# Patient Record
Sex: Male | Born: 1935 | Race: Black or African American | Hispanic: No | Smoking: Former smoker
Health system: Southern US, Community
[De-identification: ages and names within clinical notes are randomized; demographics above are authoritative.]

## PROBLEM LIST (undated history)

## (undated) DIAGNOSIS — C801 Malignant (primary) neoplasm, unspecified: Secondary | ICD-10-CM

## (undated) DIAGNOSIS — I10 Essential (primary) hypertension: Secondary | ICD-10-CM

## (undated) DIAGNOSIS — Z972 Presence of dental prosthetic device (complete) (partial): Secondary | ICD-10-CM

## (undated) DIAGNOSIS — E785 Hyperlipidemia, unspecified: Secondary | ICD-10-CM

## (undated) HISTORY — PX: COLON SURGERY: SHX602

## (undated) HISTORY — PX: PROSTATE SURGERY: SHX751

---

## 1998-03-03 DIAGNOSIS — I219 Acute myocardial infarction, unspecified: Secondary | ICD-10-CM

## 1998-03-03 HISTORY — DX: Acute myocardial infarction, unspecified: I21.9

## 2018-03-08 ENCOUNTER — Encounter: Payer: Self-pay | Admitting: Emergency Medicine

## 2018-03-08 ENCOUNTER — Ambulatory Visit: Payer: Medicare Other

## 2018-03-08 ENCOUNTER — Ambulatory Visit
Admission: EM | Admit: 2018-03-08 | Discharge: 2018-03-08 | Disposition: A | Discharge status: Home / Self Care | Payer: Medicare Other | Attending: Family Medicine | Admitting: Family Medicine

## 2018-03-08 ENCOUNTER — Other Ambulatory Visit: Payer: Self-pay

## 2018-03-08 DIAGNOSIS — J181 Lobar pneumonia, unspecified organism: Secondary | ICD-10-CM

## 2018-03-08 DIAGNOSIS — Z87891 Personal history of nicotine dependence: Secondary | ICD-10-CM

## 2018-03-08 DIAGNOSIS — J189 Pneumonia, unspecified organism: Secondary | ICD-10-CM

## 2018-03-08 HISTORY — DX: Malignant (primary) neoplasm, unspecified: C80.1

## 2018-03-08 HISTORY — DX: Hyperlipidemia, unspecified: E78.5

## 2018-03-08 HISTORY — DX: Essential (primary) hypertension: I10

## 2018-03-08 MED ORDER — HYDROCOD POLST-CPM POLST ER 10-8 MG/5ML PO SUER: 5.0000 mL | Freq: Every evening | ORAL | 0 refills | Status: DC | PRN

## 2018-03-08 MED ORDER — AZITHROMYCIN 250 MG PO TABS: ORAL_TABLET | ORAL | 0 refills | Status: DC

## 2018-03-08 MED ORDER — AMOXICILLIN-POT CLAVULANATE ER 1000-62.5 MG PO TB12: 2.0000 | ORAL_TABLET | Freq: Two times a day (BID) | ORAL | 0 refills | Status: AC

## 2018-03-08 NOTE — ED Provider Notes (Signed)
[facility]    CSN: [id number] Arrival date & time: 03/08/18  1059  History   Chief Complaint Chief Complaint  Patient presents with  . Cough   HPI  85 year old male presents with cough.  2-week history of cough.  Severe cough.  Associated shortness of breath.  Worse over the past 2 days.  No fever.  No chills.  No medications or interventions tried.  Patient states that he did not feel comfortable taking anything due to his high blood pressure.  Patient is a former smoker.  He smoked from age 87 to age 89.  Worse at night.  No relieving factors.  No other associated symptoms.  No other complaints.  History reviewed and updated as below.  Past Medical History:  Diagnosis Date  . Cancer [facility])    colon, prostate  . Hyperlipemia   . Hypertension    Past Surgical History:  Procedure Laterality Date  . COLON SURGERY    . PROSTATE SURGERY     Home Medications    Prior to Admission medications   Medication Sig Start Date End Date Taking? Authorizing Provider  amLODipine-olmesartan (AZOR) 10-40 MG tablet Take 1 tablet by mouth daily.   Yes [provider]  aspirin 81 MG chewable tablet Chew by mouth daily.   Yes [provider]  atorvastatin (LIPITOR) 40 MG tablet Take 40 mg by mouth daily.   Yes [provider]  isosorbide mononitrate (IMDUR) 120 MG 24 hr tablet Take 120 mg by mouth daily.   Yes [provider]  metoprolol succinate (TOPROL-XL) 100 MG 24 hr tablet Take 100 mg by mouth daily. Take with or immediately following a meal.   Yes [provider]  amoxicillin-clavulanate (AUGMENTIN XR) 1000-62.5 MG 12 hr tablet Take 2 tablets by mouth 2 (two) times daily for 7 days. 03/08/18 03/15/18  Coral Spikes, DO  azithromycin (ZITHROMAX) 250 MG tablet 2 tablets on day 1, then 1 tablet daily on days 2-5. 03/08/18   Coral Spikes, DO  chlorpheniramine-HYDROcodone (TUSSIONEX PENNKINETIC ER) 10-8 MG/5ML SUER Take 5 mLs by mouth at  bedtime as needed. 03/08/18   Coral Spikes, DO   Social History Social History   Tobacco Use  . Smoking status: Former Smoker    Types: Cigarettes  . Smokeless tobacco: Never Used  Substance Use Topics  . Alcohol use: Not Currently    Frequency: Never    Comment: stopped drinking in 1976  . Drug use: Never     Allergies   Patient has no known allergies.   Review of Systems Review of Systems  Constitutional: Negative for fever.  Respiratory: Positive for cough and shortness of breath.    Physical Exam Triage Vital Signs ED Triage Vitals  Enc Vitals Group     BP 03/08/18 1139 116/62     Pulse Rate 03/08/18 1139 80     Resp 03/08/18 1139 16     Temp 03/08/18 1139 98.7 F (37.1 C)     Temp Source 03/08/18 1139 Oral     SpO2 03/08/18 1139 95 %     Weight 03/08/18 1133 182 lb (82.6 kg)     Height 03/08/18 1133 5\' 9"  (1.753 m)     Head Circumference --      Peak Flow --      Pain Score 03/08/18 1133 0     Pain Loc --      Pain Edu? --      Excl. in  [place]? --    Updated Vital Signs BP 116/62 (BP Location: Left Arm)   Pulse 80   Temp 98.7 F (37.1 C) (Oral)   Resp 16   Ht 5\' 9"  (1.753 m)   Wt 82.6 kg   SpO2 95%   BMI 26.88 kg/m   Visual Acuity Right Eye Distance:   Left Eye Distance:   Bilateral Distance:    Right Eye Near:   Left Eye Near:    Bilateral Near:     Physical Exam Constitutional:      General: He is not in acute distress.    Appearance: Normal appearance.  HENT:     Head: Normocephalic and atraumatic.     Mouth/Throat:     Pharynx: Oropharynx is clear. No posterior oropharyngeal erythema.  Eyes:     General:        Right eye: No discharge.        Left eye: No discharge.     Conjunctiva/sclera: Conjunctivae normal.  Cardiovascular:     Rate and Rhythm: Normal rate and regular rhythm.  Pulmonary:     Effort: Pulmonary effort is normal.     Breath sounds: Wheezing and rales present.  Neurological:     Mental Status: He is alert.    Psychiatric:        Mood and Affect: Mood normal.        Behavior: Behavior normal.    UC Treatments / Results  Labs (all labs ordered are listed, but only abnormal results are displayed) Labs Reviewed - No data to display  EKG None  Radiology Dg Chest 2 View  Result Date: 03/08/2018 CLINICAL DATA:  Nonproductive cough for several weeks. Coronary artery disease. Personal history of colon and prostate carcinoma EXAM: CHEST - 2 VIEW COMPARISON:  None. FINDINGS: The heart size and mediastinal contours are within normal limits. Aortic atherosclerosis. Mild asymmetric opacity is seen in the left lower lobe, consistent with pneumonia. Right lung is clear. No evidence of pleural effusion. IMPRESSION: Mild left lower lobe infiltrate, consistent with pneumonia. Recommend clinical correlation, and followup PA and lateral chest X-ray in several weeks to ensure resolution. Electronically Signed   By: Earle Gell M.D.   On: 03/08/2018 13:08    Procedures Procedures (including critical care time)  Medications Ordered in UC Medications - No data to display  Initial Impression / Assessment and Plan / UC Course  I have reviewed the triage vital signs and the nursing notes.  Pertinent labs & imaging results that were available during my care of the patient were reviewed by me and considered in my medical decision making (see chart for details).    85 year old male presents with community-acquired pneumonia.  Treating with Augmentin and azithromycin.  Tussionex at night for cough advised repeat x-ray in 3 to 4 weeks by his PCP to ensure resolution.  Final Clinical Impressions(s) / UC Diagnoses   Final diagnoses:  Community acquired pneumonia of left lower lobe of lung ([place])     Discharge Instructions     Take both antibiotics as prescribed.  Cough medication as needed.  Repeat xray in 3-4 weeks.  Take care  Dr. Lacinda Axon    ED Prescriptions    Medication Sig Dispense Auth. Provider    amoxicillin-clavulanate (AUGMENTIN XR) 1000-62.5 MG 12 hr tablet Take 2 tablets by mouth 2 (two) times daily for 7 days. 28 tablet Annai Heick G, DO   azithromycin (ZITHROMAX) 250 MG tablet 2 tablets on day 1, then 1  tablet daily on days 2-5. 6 tablet [place], Carthel Castille G, DO   chlorpheniramine-HYDROcodone (TUSSIONEX PENNKINETIC ER) 10-8 MG/5ML SUER Take 5 mLs by mouth at bedtime as needed. 60 mL Coral Spikes, DO     Controlled Substance Prescriptions [place] Controlled Substance Registry consulted? Not Applicable   Coral Spikes, DO 03/08/18 1423

## 2018-03-08 NOTE — ED Triage Notes (Signed)
Pt c/o dry cough, runny nose and shortness of breath. Cough is keeping him up at night. Started about 2 weeks ago but has gotten worse in the last 2 days. denies fever, and chills.

## 2018-03-08 NOTE — Discharge Instructions (Signed)
Take both antibiotics as prescribed.  Cough medication as needed.  Repeat xray in 3-4 weeks.  Take care  Dr. Lacinda Axon

## 2018-03-29 ENCOUNTER — Encounter: Payer: Self-pay | Admitting: Emergency Medicine

## 2018-03-29 ENCOUNTER — Ambulatory Visit
Admission: EM | Admit: 2018-03-29 | Discharge: 2018-03-29 | Disposition: A | Discharge status: Home / Self Care | Payer: Medicare Other | Attending: Family Medicine | Admitting: Family Medicine

## 2018-03-29 ENCOUNTER — Other Ambulatory Visit: Payer: Self-pay

## 2018-03-29 ENCOUNTER — Ambulatory Visit: Payer: Medicare Other

## 2018-03-29 DIAGNOSIS — R9389 Abnormal findings on diagnostic imaging of other specified body structures: Secondary | ICD-10-CM | POA: Insufficient documentation

## 2018-03-29 DIAGNOSIS — J189 Pneumonia, unspecified organism: Secondary | ICD-10-CM

## 2018-03-29 DIAGNOSIS — J181 Lobar pneumonia, unspecified organism: Secondary | ICD-10-CM | POA: Diagnosis not present

## 2018-03-29 MED ORDER — LEVOFLOXACIN 750 MG PO TABS: 750.0000 mg | ORAL_TABLET | Freq: Every day | ORAL | 0 refills | Status: DC

## 2018-03-29 NOTE — ED Triage Notes (Signed)
Patient here for repeat chest xray to follow up from his visit on 01/06 for pneumonia. Patient states he is feeling much better.

## 2018-03-29 NOTE — Discharge Instructions (Addendum)
Take medication as prescribed. Rest. Drink plenty of fluids.   Please establish a primary care physician for regular follow-up.  This is very important.  You need to have repeat chest x-ray in 3 weeks to further evaluate potential persistent pneumonia versus other concerns discussed today.   Return to Urgent care for new or worsening concerns.

## 2018-03-29 NOTE — ED Provider Notes (Signed)
[facility] ____________________________________________  Time seen: Approximately 9:41 AM  I have reviewed the triage vital signs and the nursing notes.   HISTORY  Chief Complaint Follow-up   HPI Brett Roberson is a 85 y.o. male presenting for reevaluation of recent diagnosis of left lower community-acquired pneumonia.  Patient was seen in urgent care approximately 3 weeks ago and received this diagnosis post chest x-ray.  Patient states he had had cough and congestion for approximately a week.  Felt like it initially started out as a cold.  States he is here today for posttreatment follow-up as directed.  Patient states however he does not have a primary care physician establishments area which is why he came here.  States he recently moved to this area from [place].  Does follow with a prostate doctor at [facility], and states that he is working with him to get established with a primary care through [facility].  Patient reports that he is feeling much better.  States he does still have some coughing with phlegm production, but again states much better.  States energy level is improved.  Continues to eat and drink well.  Denies current fevers.  No hemoptysis.  Denies pain at this time.  No accompanying chest pain or shortness of breath.  Reports he has had pneumonia approximately 4 times previously, unsure what side.  Previous smoker.  Denies renal insufficiency.  No local PCP   Past Medical History:  Diagnosis Date  . Cancer [facility])    colon, prostate  . Hyperlipemia   . Hypertension     There are no active problems to display for this patient.   Past Surgical History:  Procedure Laterality Date  . COLON SURGERY    . PROSTATE SURGERY       No current facility-administered medications for this encounter.   Current Outpatient Medications:  .  amLODipine-olmesartan (AZOR) 10-40 MG tablet, Take 1 tablet by mouth daily., Disp: , Rfl:  .  aspirin 81 MG chewable tablet,  Chew by mouth daily., Disp: , Rfl:  .  atorvastatin (LIPITOR) 40 MG tablet, Take 40 mg by mouth daily., Disp: , Rfl:  .  isosorbide mononitrate (IMDUR) 120 MG 24 hr tablet, Take 120 mg by mouth daily., Disp: , Rfl:  .  metoprolol succinate (TOPROL-XL) 100 MG 24 hr tablet, Take 100 mg by mouth daily. Take with or immediately following a meal., Disp: , Rfl:  .  levofloxacin (LEVAQUIN) 750 MG tablet, Take 1 tablet (750 mg total) by mouth daily., Disp: 5 tablet, Rfl: 0  Allergies Patient has no known allergies.  History reviewed. No pertinent family history.  Social History Social History   Tobacco Use  . Smoking status: Former Smoker    Types: Cigarettes  . Smokeless tobacco: Never Used  Substance Use Topics  . Alcohol use: Not Currently    Frequency: Never    Comment: stopped drinking in 1976  . Drug use: Never    Review of Systems Constitutional: No fever/chills ENT: No sore throat. Cardiovascular: Denies chest pain. Respiratory: Denies shortness of breath. Gastrointestinal: No abdominal pain.   Genitourinary: Negative for dysuria. Musculoskeletal: Negative for back pain. Skin: Negative for rash.   ____________________________________________   PHYSICAL EXAM:  VITAL SIGNS: ED Triage Vitals  Enc Vitals Group     BP 03/29/18 0848 121/67     Pulse Rate 03/29/18 0848 68     Resp 03/29/18 0848 18     Temp 03/29/18 0848 98 F (36.7 C)  Temp Source 03/29/18 0848 Oral     SpO2 03/29/18 0848 99 %     Weight 03/29/18 0846 170 lb (77.1 kg)     Height 03/29/18 0846 5\' 9"  (1.753 m)     Head Circumference --      Peak Flow --      Pain Score 03/29/18 0846 0     Pain Loc --      Pain Edu? --      Excl. in [place]? --    Constitutional: Alert and oriented. Well appearing and in no acute distress. Eyes: Conjunctivae are normal.  Head: Atraumatic. No sinus tenderness to palpation. No swelling. No erythema.  Nose:No nasal congestion   Mouth/Throat: Mucous membranes are  moist. No pharyngeal erythema. No tonsillar swelling or exudate.  Neck: No stridor.  No cervical spine tenderness to palpation. Hematological/Lymphatic/Immunilogical: No cervical lymphadenopathy. Cardiovascular: Normal rate, regular rhythm. Grossly normal heart sounds.  Good peripheral circulation. Respiratory: Normal respiratory effort.  No retractions. No wheezes.  Bilateral base rhonchi present, left greater than right.  Good air movement.  Occasional dry cough noted in room.  Speaks in complete sentences. Gastrointestinal: Soft and nontender.  Musculoskeletal: Ambulatory with steady gait.  No lower extremity edema noted bilaterally. Neurologic:  Normal speech and language. No gait instability. Skin:  Skin appears warm, dry and intact. No rash noted. Psychiatric: Mood and affect are normal. Speech and behavior are normal. ___________________________________________   LABS (all labs ordered are listed, but only abnormal results are displayed)  Labs Reviewed - No data to display  RADIOLOGY  Dg Chest 2 View  Result Date: 03/29/2018 CLINICAL DATA:  85 year old with follow-up pneumonia. EXAM: CHEST - 2 VIEW COMPARISON:  03/08/2018 FINDINGS: Persistent patchy densities at the left lung base. Minimal improvement from the previous examination. Mild blunting at the right costophrenic angle is unchanged but no large pleural effusions. The upper lungs are clear. Heart and mediastinum are within normal limits. Atherosclerotic calcifications at the aortic arch. Degenerative changes and bridging osteophytes in the thoracic spine. IMPRESSION: Persistent densities at the left lung base with minimal improvement. Findings could represent postinflammatory changes but indeterminate. Recommend another follow-up in 3 weeks to evaluate the left basilar disease. If the left basilar disease does not resolve, patient may benefit from a chest CT to exclude a neoplastic process. Electronically Signed   By: Markus Daft  M.D.   On: 03/29/2018 09:07   ____________________________________________   PROCEDURES Procedures    INITIAL IMPRESSION / ASSESSMENT AND PLAN / ED COURSE  Pertinent labs & imaging results that were available during my care of the patient were reviewed by me and considered in my medical decision making (see chart for details).  Well-appearing patient.  No acute distress.  Patient was seen and diagnosed on 03/08/2018 with left lower pneumonia, presented to urgent care for follow-up as he reports no local PCP.  Chest x-ray similar to previous with minimal improvement in left lung densities.  Discussed this by phone with radiologist Dr. Anselm Pancoast.  Concern for possible continued pneumonia versus inflammatory changes versus chronic changes versus neoplastic process.  Patient continues with cough and mucus production, rhonchi auscultated, will place patient on oral antibiotics again.  Patient denied renal insufficiency, however noted through [facility] chart last creatinine 1.4.  Creatinine clearance calculated at 44.  Levaquin dose altered via phone with [facility]  Will place patient on 500 mg once day 1 then 250 mg daily for 1 week.  Strongly recommend for patient to call  today to continue his work on establishing primary care.  Repeat chest x-ray in 3 weeks and possible CT needed.  Sooner return evaluations given.Discussed indication, risks and benefits of medications with patient.  . Discussed follow up and return parameters including no resolution or any worsening concerns. Patient verbalized understanding and agreed to plan.   ____________________________________________   FINAL CLINICAL IMPRESSION(S) / ED DIAGNOSES  Final diagnoses:  Pneumonia of left lower lobe due to infectious organism [facility])  Abnormal chest x-ray     ED Discharge Orders         Ordered    levofloxacin (LEVAQUIN) 750 MG tablet  Daily     03/29/18 0939           Note: This dictation was prepared with Dragon dictation along  with smaller phrase technology. Any transcriptional errors that result from this process are unintentional.         Marylene Land, NP 03/29/18 1030

## 2018-05-31 HISTORY — PX: CORONARY ARTERY BYPASS GRAFT: SHX141

## 2018-05-31 HISTORY — PX: CARDIAC VALVE REPLACEMENT: SHX585

## 2019-06-29 ENCOUNTER — Other Ambulatory Visit: Payer: Self-pay | Admitting: Physician Assistant

## 2019-06-29 DIAGNOSIS — D385 Neoplasm of uncertain behavior of other respiratory organs: Secondary | ICD-10-CM

## 2019-07-11 ENCOUNTER — Other Ambulatory Visit: Payer: Self-pay

## 2019-07-11 ENCOUNTER — Ambulatory Visit
Admission: RE | Admit: 2019-07-11 | Discharge: 2019-07-11 | Disposition: A | Discharge status: Home / Self Care | Payer: Medicare Other | Source: Ambulatory Visit | Attending: Physician Assistant | Admitting: Physician Assistant

## 2019-07-11 DIAGNOSIS — D385 Neoplasm of uncertain behavior of other respiratory organs: Secondary | ICD-10-CM | POA: Insufficient documentation

## 2019-07-11 LAB — POCT I-STAT CREATININE: Creatinine, Ser: 1.5 mg/dL — ABNORMAL HIGH (ref 0.61–1.24)

## 2019-07-11 MED ORDER — IOHEXOL 300 MG/ML  SOLN
75.0000 mL | Freq: Once | INTRAMUSCULAR | Status: AC | PRN
  Administered 2019-07-11: 14:00:00 75 mL via INTRAVENOUS

## 2019-07-20 ENCOUNTER — Encounter: Payer: Self-pay | Admitting: Otolaryngology

## 2019-07-20 ENCOUNTER — Other Ambulatory Visit: Payer: Self-pay

## 2019-07-29 ENCOUNTER — Other Ambulatory Visit: Payer: Self-pay

## 2019-07-29 ENCOUNTER — Other Ambulatory Visit
Admission: RE | Admit: 2019-07-29 | Discharge: 2019-07-29 | Disposition: A | Discharge status: Home / Self Care | Payer: Medicare Other | Source: Ambulatory Visit | Attending: Otolaryngology | Admitting: Otolaryngology

## 2019-07-29 DIAGNOSIS — Z01812 Encounter for preprocedural laboratory examination: Secondary | ICD-10-CM | POA: Diagnosis present

## 2019-07-29 DIAGNOSIS — Z20822 Contact with and (suspected) exposure to covid-19: Secondary | ICD-10-CM | POA: Insufficient documentation

## 2019-07-29 LAB — SARS CORONAVIRUS 2 (TAT 6-24 HRS): SARS Coronavirus 2: NEGATIVE

## 2019-08-02 ENCOUNTER — Ambulatory Visit: Payer: Medicare Other | Admitting: Anesthesiology

## 2019-08-02 ENCOUNTER — Encounter
Admission: RE | Disposition: A | Discharge status: Home / Self Care | Payer: Self-pay | Source: Home / Self Care | Attending: Otolaryngology

## 2019-08-02 ENCOUNTER — Ambulatory Visit
Admission: RE | Admit: 2019-08-02 | Discharge: 2019-08-02 | Disposition: A | Discharge status: Home / Self Care | Payer: Medicare Other | Attending: Otolaryngology | Admitting: Otolaryngology

## 2019-08-02 ENCOUNTER — Encounter: Payer: Self-pay | Admitting: Otolaryngology

## 2019-08-02 ENCOUNTER — Other Ambulatory Visit: Payer: Self-pay

## 2019-08-02 DIAGNOSIS — I10 Essential (primary) hypertension: Secondary | ICD-10-CM | POA: Diagnosis not present

## 2019-08-02 DIAGNOSIS — Z8546 Personal history of malignant neoplasm of prostate: Secondary | ICD-10-CM | POA: Insufficient documentation

## 2019-08-02 DIAGNOSIS — Z85038 Personal history of other malignant neoplasm of large intestine: Secondary | ICD-10-CM | POA: Diagnosis not present

## 2019-08-02 DIAGNOSIS — Z952 Presence of prosthetic heart valve: Secondary | ICD-10-CM | POA: Insufficient documentation

## 2019-08-02 DIAGNOSIS — Z87891 Personal history of nicotine dependence: Secondary | ICD-10-CM | POA: Diagnosis not present

## 2019-08-02 DIAGNOSIS — Z79899 Other long term (current) drug therapy: Secondary | ICD-10-CM | POA: Diagnosis not present

## 2019-08-02 DIAGNOSIS — I252 Old myocardial infarction: Secondary | ICD-10-CM | POA: Diagnosis not present

## 2019-08-02 DIAGNOSIS — J329 Chronic sinusitis, unspecified: Secondary | ICD-10-CM | POA: Insufficient documentation

## 2019-08-02 DIAGNOSIS — E785 Hyperlipidemia, unspecified: Secondary | ICD-10-CM | POA: Insufficient documentation

## 2019-08-02 DIAGNOSIS — J339 Nasal polyp, unspecified: Secondary | ICD-10-CM | POA: Diagnosis not present

## 2019-08-02 DIAGNOSIS — Z7982 Long term (current) use of aspirin: Secondary | ICD-10-CM | POA: Insufficient documentation

## 2019-08-02 HISTORY — PX: ETHMOIDECTOMY: SHX5197

## 2019-08-02 HISTORY — DX: Presence of dental prosthetic device (complete) (partial): Z97.2

## 2019-08-02 HISTORY — PX: IMAGE GUIDED SINUS SURGERY: SHX6570

## 2019-08-02 HISTORY — PX: MAXILLARY ANTROSTOMY: SHX2003

## 2019-08-02 SURGERY — SINUS SURGERY, WITH IMAGING GUIDANCE
Anesthesia: General | Site: Nose | Laterality: Right

## 2019-08-02 MED ORDER — FENTANYL CITRATE (PF) 100 MCG/2ML IJ SOLN
INTRAMUSCULAR | Status: DC | PRN
  Administered 2019-08-02 (×2): 25 ug via INTRAVENOUS
  Administered 2019-08-02: 50 ug via INTRAVENOUS
  Administered 2019-08-02: 25 ug via INTRAVENOUS

## 2019-08-02 MED ORDER — DEXAMETHASONE SODIUM PHOSPHATE 4 MG/ML IJ SOLN
INTRAMUSCULAR | Status: DC | PRN
  Administered 2019-08-02: 4 mg via INTRAVENOUS

## 2019-08-02 MED ORDER — OXYMETAZOLINE HCL 0.05 % NA SOLN
NASAL | Status: DC | PRN
  Administered 2019-08-02: 1

## 2019-08-02 MED ORDER — MIDAZOLAM HCL 5 MG/5ML IJ SOLN
INTRAMUSCULAR | Status: DC | PRN
  Administered 2019-08-02: 1 mg via INTRAVENOUS

## 2019-08-02 MED ORDER — HYDROCODONE-ACETAMINOPHEN 5-325 MG PO TABS: 1.0000 | ORAL_TABLET | Freq: Four times a day (QID) | ORAL | 0 refills | Status: AC | PRN

## 2019-08-02 MED ORDER — ONDANSETRON HCL 4 MG/2ML IJ SOLN
INTRAMUSCULAR | Status: DC | PRN
  Administered 2019-08-02: 4 mg via INTRAVENOUS

## 2019-08-02 MED ORDER — SUCCINYLCHOLINE CHLORIDE 20 MG/ML IJ SOLN
INTRAMUSCULAR | Status: DC | PRN
  Administered 2019-08-02: 100 mg via INTRAVENOUS

## 2019-08-02 MED ORDER — LIDOCAINE HCL (CARDIAC) PF 100 MG/5ML IV SOSY
PREFILLED_SYRINGE | INTRAVENOUS | Status: DC | PRN
  Administered 2019-08-02: 40 mg via INTRAVENOUS

## 2019-08-02 MED ORDER — PHENYLEPHRINE HCL (PRESSORS) 10 MG/ML IV SOLN
INTRAVENOUS | Status: DC | PRN
Start: 2019-08-02 — End: 2019-08-02
  Administered 2019-08-02: 100 ug via INTRAVENOUS
  Administered 2019-08-02 (×2): 50 ug via INTRAVENOUS
  Administered 2019-08-02: 100 ug via INTRAVENOUS

## 2019-08-02 MED ORDER — LACTATED RINGERS IV SOLN: INTRAVENOUS | Status: DC

## 2019-08-02 MED ORDER — ACETAMINOPHEN 10 MG/ML IV SOLN
1000.0000 mg | Freq: Once | INTRAVENOUS | Status: AC
  Administered 2019-08-02: 1000 mg via INTRAVENOUS

## 2019-08-02 MED ORDER — EPHEDRINE SULFATE 50 MG/ML IJ SOLN
INTRAMUSCULAR | Status: DC | PRN
Start: 2019-08-02 — End: 2019-08-02
  Administered 2019-08-02: 5 mg via INTRAVENOUS
  Administered 2019-08-02: 10 mg via INTRAVENOUS
  Administered 2019-08-02: 5 mg via INTRAVENOUS

## 2019-08-02 MED ORDER — GLYCOPYRROLATE 0.2 MG/ML IJ SOLN
INTRAMUSCULAR | Status: DC | PRN
  Administered 2019-08-02: .1 mg via INTRAVENOUS

## 2019-08-02 MED ORDER — PROPOFOL 10 MG/ML IV BOLUS
INTRAVENOUS | Status: DC | PRN
  Administered 2019-08-02: 100 mg via INTRAVENOUS

## 2019-08-02 MED ORDER — LIDOCAINE-EPINEPHRINE 1 %-1:100000 IJ SOLN
INTRAMUSCULAR | Status: DC | PRN
  Administered 2019-08-02: 10 mL

## 2019-08-02 MED ORDER — AMOXICILLIN-POT CLAVULANATE 875-125 MG PO TABS
1.0000 | ORAL_TABLET | Freq: Two times a day (BID) | ORAL | 0 refills | Status: AC
Start: 2019-08-02 — End: ?

## 2019-08-02 SURGICAL SUPPLY — 22 items
BATTERY INSTRU NAVIGATION (MISCELLANEOUS) ×9 IMPLANT
CANISTER SUCT 1200ML W/VALVE (MISCELLANEOUS) ×3 IMPLANT
COAG SUCT 10F 3.5MM HAND CTRL (MISCELLANEOUS) ×3 IMPLANT
ELECT REM PT RETURN 9FT ADLT (ELECTROSURGICAL) ×3
ELECTRODE REM PT RTRN 9FT ADLT (ELECTROSURGICAL) ×2 IMPLANT
GLOVE BIO SURGEON STRL SZ7.5 (GLOVE) ×6 IMPLANT
GOWN STRL REUS W/ TWL LRG LVL3 (GOWN DISPOSABLE) ×2 IMPLANT
GOWN STRL REUS W/TWL LRG LVL3 (GOWN DISPOSABLE) ×1
IV NS 500ML (IV SOLUTION) ×1
IV NS 500ML BAXH (IV SOLUTION) ×2 IMPLANT
KIT TURNOVER KIT A (KITS) ×3 IMPLANT
NS IRRIG 500ML POUR BTL (IV SOLUTION) ×3 IMPLANT
PACK ENT CUSTOM (PACKS) ×3 IMPLANT
PACKING NASAL EPIS 4X2.4 XEROG (MISCELLANEOUS) ×1 IMPLANT
PATTIES SURGICAL .5 X3 (DISPOSABLE) ×3 IMPLANT
SHAVER DIEGO BLD STD TYPE A (BLADE) ×3 IMPLANT
SOL ANTI-FOG 6CC FOG-OUT (MISCELLANEOUS) ×2 IMPLANT
SOL FOG-OUT ANTI-FOG 6CC (MISCELLANEOUS) ×1
SYR 10ML LL (SYRINGE) ×3 IMPLANT
TRACKER CRANIALMASK (MASK) ×3 IMPLANT
TUBING DECLOG MULTIDEBRIDER (TUBING) ×3 IMPLANT
WATER STERILE IRR 250ML POUR (IV SOLUTION) ×2 IMPLANT

## 2019-08-02 NOTE — Transfer of Care (Signed)
Immediate Anesthesia Transfer of Care Note  Patient: Brett Roberson  Procedure(s) Performed: IMAGE GUIDED SINUS SURGERY (N/A Nose) ETHMOIDECTOMY (Right Nose) MAXILLARY ANTROSTOMY WITH TISSUE (N/A Nose)  Patient Location: PACU  Anesthesia Type: General  Level of Consciousness: awake, alert  and patient cooperative  Airway and Oxygen Therapy: Patient Spontanous Breathing and Patient connected to supplemental oxygen  Post-op Assessment: Post-op Vital signs reviewed, Patient's Cardiovascular Status Stable, Respiratory Function Stable, Patent Airway and No signs of Nausea or vomiting  Post-op Vital Signs: Reviewed and stable  Complications: No apparent anesthesia complications

## 2019-08-02 NOTE — H&P (Signed)
History and physical reviewed and will be scanned in later. No change in medical status reported by the patient or family, appears stable for surgery. All questions regarding the procedure answered, and patient (or family if a child) expressed understanding of the procedure. ? ?Brett Roberson S [name] ?@TODAY@ ?

## 2019-08-02 NOTE — Op Note (Signed)
08/02/2019  10:31 AM    Brett Roberson  HR:[id number]   Pre-Op Diagnosis:  chronic sinusitis, nasal polyps  Post-op Diagnosis: chronic sinusitis, nasal polyps  Procedure:  1)  Image Guided Sinus Surgery,   2)  Bilateral Endoscopic Maxillary Antrostomy with Tissue Removal   3) Right total Ethmoidectomy      Surgeon:  Riley Nearing  Anesthesia:  General endotracheal  EBL:  Less than [id number]  Complications:  None  Findings: Polyps in right maxillary sinus obstructing the ostium with dense inspicated secretions. Polypoid edema right anterior to middle ethmoids. Smaller polyps and mucous retention cyst left maxillary sinus.   Procedure: After the patient was identified in holding and the benefits of the procedure were reviewed as well as the consent and risks, the patient was taken to the operating room and with the patient in a comfortable supine position,  general orotracheal anesthesia was induced without difficulty.  A proper time-out was performed.  The [employer] image guidance system was set up and calibrated in the normal fashion and felt to be acceptable.  Next 1% Xylocaine with 1:100,000 epinephrine was infiltrated into the lateral nasal wall in the uncinate region and anterior middle turbinates bilaterally.  Several minutes were allowed for this to take effect.  Cottoniod pledgets soaked in Afrin were placed into both nasal cavities and left while the patient was prepped and draped in the standard fashion. The image guided suction was calibrated and used to inspect known points in the nasal cavity to assess accuracy of the image guided system. Accuracy was felt to be excellent.   The left middle turbinate was medialized and the uncinate process then resected with through-cutting forceps as well as the microdebrider. In this fashion the uncinate was completely removed along with polypoid soft tissue and bone of the medial wall of the maxillary sinus to create a large patent maxillary  antrostomy. A 70 degree scope was used to better visualize inside the sinus and small polyps and a mucous retention cyst were removed with an angled forcep. The left maxillary sinus was suctioned to clear secretions.   Attention was then turned to the right side where the same procedure was performed, opening the maxillary sinus. Larger formed polyps on this side blocked both the primary and secondary os. Dense secretions were noted in the sinus and were suctioned and sent for pathology. Cultures were also taken. Polyps just inside the sinus were debrided and tissue sent for pathology. The sinus was repeatedly irrigated and angled suctions and forceps used to debride the dense material from the sinus until the sinus was clear.   Next the ethmoid bulla was entered after assessing the anatomy with the image guided suction. It was widely opened with thru cutting forceps and use of the microdebrider. Polyos encountered in the anterior ethmoids were debrided and sent for pathology. Dissection proceeded through the basal lamella into the middle and posterior ethmoids, using thru cutting forceps and the debrider to open the sinuses, frequently reassessing the anatomy with the image guided suction.   The nose was suctioned and inspected. Xerogel absorbable sinus packing was then placed in the right ethmoid cavity. No packing was used on the left.   The patient was then returned to the anesthesiologist for awakening and taken to recovery room in good condition postoperatively.  Disposition:   PACU and d/c home  Plan: Ice, elevation, narcotic analgesia and prophylactic antibiotics. Begin sinus irrigations with saline tomrrow, irrigating 3-4 times daily. Return to the office  in 7 days.  Return to work in 7-10 days, no strenuous activities for two weeks.   Riley Nearing 08/02/2019 10:31 AM

## 2019-08-02 NOTE — Discharge Instructions (Signed)
[facility] ENDOSCOPIC SINUS SURGERY [facility]  What is Functional Endoscopic Sinus Surgery?  The Surgery involves making the natural openings of the sinuses larger by removing the bony partitions that separate the sinuses from the nasal cavity.  The natural sinus lining is preserved as much as possible to allow the sinuses to resume normal function after the surgery.  In some patients nasal polyps (excessively swollen lining of the sinuses) may be removed to relieve obstruction of the sinus openings.  The surgery is performed through the nose using lighted scopes, which eliminates the need for incisions on the face.  A septoplasty is a different procedure which is sometimes performed with sinus surgery.  It involves straightening the boy partition that separates the two sides of your nose.  A crooked or deviated septum may need repair if is obstructing the sinuses or nasal airflow.  Turbinate reduction is also often performed during sinus surgery.  The turbinates are bony proturberances from the side walls of the nose which swell and can obstruct the nose in patients with sinus and allergy problems.  Their size can be surgically reduced to help relieve nasal obstruction.  What Can Sinus Surgery Do For Me?  Sinus surgery can reduce the frequency of sinus infections requiring antibiotic treatment.  This can provide improvement in nasal congestion, post-nasal drainage, facial pressure and nasal obstruction.  Surgery will NOT prevent you from ever having an infection again, so it usually only for patients who get infections 4 or more times yearly requiring antibiotics, or for infections that do not clear with antibiotics.  It will not cure nasal allergies, so patients with allergies may still require medication to treat their allergies after surgery. Surgery may improve headaches related to sinusitis, however, some people will continue to  require medication to control sinus headaches related to allergies.  Surgery will do nothing for other forms of headache (migraine, tension or cluster).  What Are the Risks of Endoscopic Sinus Surgery?  Current techniques allow surgery to be performed safely with little risk, however, there are rare complications that patients should be aware of.  Because the sinuses are located around the eyes, there is risk of eye injury, including blindness, though again, this would be quite rare. This is usually a result of bleeding behind the eye during surgery, which puts the vision oat risk, though there are treatments to protect the vision and prevent permanent disrupted by surgery causing a leak of the spinal fluid that surrounds the brain.  More serious complications would include bleeding inside the brain cavity or damage to the brain.  Again, all of these complications are uncommon, and spinal fluid leaks can be safely managed surgically if they occur.  The most common complication of sinus surgery is bleeding from the nose, which may require packing or cauterization of the nose.  Continued sinus have polyps may experience recurrence of the polyps requiring revision surgery.  Alterations of sense of smell or injury to the tear ducts are also rare complications.   What is the Surgery Like, and what is the Recovery?  The Surgery usually takes a couple of hours to perform, and is usually performed under a general anesthetic (completely asleep).  Patients are usually discharged home after a couple of hours.  Sometimes during surgery it is necessary to pack the nose to control bleeding, and the packing is left in place for 24 - 48 hours, and removed by your surgeon.    If a septoplasty was performed during the procedure, there is often a splint placed which must be removed after 5-7 days.   Discomfort: Pain is usually mild to moderate, and can be controlled by prescription pain medication or acetaminophen (Tylenol).   Aspirin, Ibuprofen (Advil, Motrin), or Naprosyn (Aleve) should be avoided, as they can cause increased bleeding.  Most patients feel sinus pressure like they have a bad head cold for several days.  Sleeping with your head elevated can help reduce swelling and facial pressure, as can ice packs over the face.  A humidifier may be helpful to keep the mucous and blood from drying in the nose.   Diet: There are no specific diet restrictions, however, you should generally start with clear liquids and a light diet of bland foods because the anesthetic can cause some nausea.  Advance your diet depending on how your stomach feels.  Taking your pain medication with food will often help reduce stomach upset which pain medications can cause.  Nasal Saline Irrigation: It is important to remove blood clots and dried mucous from the nose as it is healing.  This is done by having you irrigate the nose at least 3 - 4 times daily with a salt water solution.  We recommend using NeilMed Sinus Rinse (available at the drug store).  Fill the squeeze bottle with the solution, bend over a sink, and insert the tip of the squeeze bottle into the nose  of an inch.  Point the tip of the squeeze bottle towards the inside corner of the eye on the same side your irrigating.  Squeeze the bottle and gently irrigate the nose.  If you bend forward as you do this, most of the fluid will flow back out of the nose, instead of down your throat.   The solution should be warm, near body temperature, when you irrigate.   Each time you irrigate, you should use a full squeeze bottle.   Note that if you are instructed to use Nasal Steroid Sprays at any time after your surgery, irrigate with saline BEFORE using the steroid spray, so you do not wash it all out of the nose. Another product, Nasal Saline Gel (such as AYR Nasal Saline Gel) can be applied in each nostril 3 - 4 times daily to moisture the nose and reduce scabbing or crusting.  Bleeding:   Bloody drainage from the nose can be expected for several days, and patients are instructed to irrigate their nose frequently with salt water to help remove mucous and blood clots.  The drainage may be dark red or brown, though some fresh blood may be seen intermittently, especially after irrigation.  Do not blow you nose, as bleeding may occur. If you must sneeze, keep your mouth open to allow air to escape through your mouth.  If heavy bleeding occurs: Irrigate the nose with saline to rinse out clots, then spray the nose 3 - 4 times with Afrin Nasal Decongestant Spray.  The spray will constrict the blood vessels to slow bleeding.  Pinch the lower half of your nose shut to apply pressure, and lay down with your head elevated.  Ice packs over the nose may help as well. If bleeding persists despite these measures, you should notify your doctor.  Do not use the Afrin routinely to control nasal congestion after surgery, as it can result in worsening congestion and may affect healing.     Activity: Return to work varies among patients. Most patients will be   out of work at least 5 - 7 days to recover.  Patient may return to work after they are off of narcotic pain medication, and feeling well enough to perform the functions of their job.  Patients must avoid heavy lifting (over 10 pounds) or strenuous physical for 2 weeks after surgery, so your employer may need to assign you to light duty, or keep you out of work longer if light duty is not possible.  NOTE: you should not drive, operate dangerous machinery, do any mentally demanding tasks or make any important legal or financial decisions while on narcotic pain medication and recovering from the general anesthetic.  °  °Call Your Doctor Immediately if You Have Any of the Following: °1. Bleeding that you cannot control with the above measures °2. Loss of vision, double vision, bulging of the eye or black eyes. °3. Fever over 101 degrees °4. Neck stiffness with  severe headache, fever, nausea and change in mental state. °You are always encourage to call anytime with concerns, however, please call with requests for pain medication refills during office hours. ° °Office Endoscopy: During follow-up visits your doctor will remove any packing or splints that may have been placed and evaluate and clean your sinuses endoscopically.  Topical anesthetic will be used to make this as comfortable as possible, though you may want to take your pain medication prior to the visit.  How often this will need to be done varies from patient to patient.  After complete recovery from the surgery, you may need follow-up endoscopy from time to time, particularly if there is concern of recurrent infection or nasal polyps. ° ° °

## 2019-08-02 NOTE — Anesthesia Procedure Notes (Signed)
Procedure Name: Intubation Date/Time: 08/02/2019 7:56 AM Performed by: Cameron Ali, CRNA Pre-anesthesia Checklist: Patient identified, Emergency Drugs available, Suction available, Patient being monitored and Timeout performed Patient Re-evaluated:Patient Re-evaluated prior to induction Oxygen Delivery Method: Circle system utilized Preoxygenation: Pre-oxygenation with 100% oxygen Induction Type: IV induction Ventilation: Mask ventilation without difficulty Laryngoscope Size: Mac and 3 Grade View: Grade I Tube type: Oral Rae Tube size: 7.5 mm Number of attempts: 1 Placement Confirmation: ETT inserted through vocal cords under direct vision,  positive ETCO2 and breath sounds checked- equal and bilateral Tube secured with: Tape Dental Injury: Teeth and Oropharynx as per pre-operative assessment

## 2019-08-02 NOTE — Anesthesia Preprocedure Evaluation (Addendum)
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)  Rhythm:Regular Rate:Normal  TTE (04/20/19 at [facility]): MILD LV DYSFUNCTION WITH MODERATE LVH EF 50% NORMAL RIGHT VENTRICULAR SYSTOLIC FUNCTION VALVULAR REGURGITATION: TRIVIAL MR, TRIVIAL PR, MILD TR BIOPROSTHETIC AoV  Hyperlipidemia   Neuro/Psych    GI/Hepatic   Endo/Other    Renal/GU      Musculoskeletal   Abdominal   Peds  Hematology   Anesthesia Other Findings Hx colon and prostate cancer  Reproductive/Obstetrics                            Anesthesia Physical Anesthesia Plan  ASA: III  Anesthesia Plan: General   Post-op Pain Management:    Induction: Intravenous  PONV Risk Score and Plan: Ondansetron, Dexamethasone, Midazolam and Treatment may vary due to age or medical condition  Airway Management Planned: Oral ETT  Additional Equipment:   Intra-op Plan:   Post-operative Plan:   Informed Consent: I have reviewed the patients History and Physical, chart, labs and discussed the procedure including the risks, benefits and alternatives for the proposed anesthesia with the patient or authorized representative who has indicated his/her understanding and acceptance.     Dental advisory given  Plan Discussed with: CRNA  Anesthesia Plan Comments:         Anesthesia Quick Evaluation

## 2019-08-02 NOTE — Anesthesia Postprocedure Evaluation (Signed)
Anesthesia Post Note  Patient: Brett Roberson  Procedure(s) Performed: IMAGE GUIDED SINUS SURGERY (N/A Nose) ETHMOIDECTOMY (Right Nose) MAXILLARY ANTROSTOMY WITH TISSUE (N/A Nose)     Patient location during evaluation: PACU Anesthesia Type: General Level of consciousness: awake Pain management: pain level controlled Vital Signs Assessment: post-procedure vital signs reviewed and stable Respiratory status: respiratory function stable Cardiovascular status: stable Postop Assessment: no signs of nausea or vomiting Anesthetic complications: no    Veda Canning

## 2019-08-03 ENCOUNTER — Encounter: Payer: Self-pay | Admitting: *Deleted

## 2019-08-04 LAB — SURGICAL PATHOLOGY

## 2019-08-07 LAB — AEROBIC/ANAEROBIC CULTURE W GRAM STAIN (SURGICAL/DEEP WOUND)

## 2020-08-20 IMAGING — CT CT MAXILLOFACIAL W/ CM
1 series · 1 of 1 positions shown · IV contrast (omnipaque)
Comparison: None.

CLINICAL DATA: 85-year-old male has been blowing blood clots from
the right side of his nose for about 2 weeks, and sometimes can
cough of blood also.

EXAM:
CT MAXILLOFACIAL WITH CONTRAST
TECHNIQUE: Multidetector CT imaging of the maxillofacial structures was
performed with intravenous contrast. Multiplanar CT image
reconstructions were also generated.
CONTRAST:  75mL OMNIPAQUE IOHEXOL 300 MG/ML  SOLN

[Series 1: topogram 0.60 sag · sagittal · 1.00mm/px · 1 of 1 slices shown]
[im 1/1]
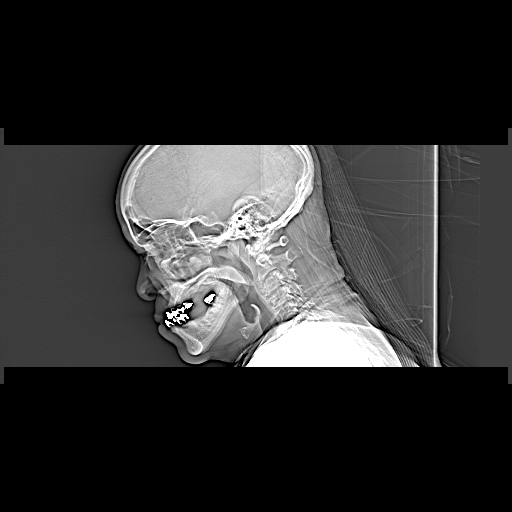

[1 of 1 positions shown; findings below may reference images not displayed]

FINDINGS: Osseous: Mandible is intact and normally located. No acute facial
fracture identified. Visible skull base and calvarium appear intact
and within normal limits. Partially visible upper cervical spine
degeneration.

Orbits: Intact orbital walls. Postoperative changes to both globes
but otherwise normal orbits soft tissues.

Sinuses: Bilateral tympanic cavities and mastoids are clear.

Somewhat hyperplastic frontal, ethmoid, and sphenoid sinuses are
well pneumatized. There is mild mucosal thickening at the right
frontoethmoidal recess, and in both anterior sphenoids. There is
pneumatization superior to both anterior ethmoidal artery notches,
and both anterior clinoid processes are somewhat pneumatized
(coronal image 40).

Subtotal opacification of the right maxillary sinus is noted with
mixed density both within the sinus and the opacified adjacent right
middle meatus. And there is a polypoid component in the right middle
meatus which is about 2.3 cm in length (series 2, image 62).
However, furthermore, there are permeative changes in the posterior
wall of the right maxillary sinus on series 4, image 65, associated
with a small volume of asymmetric retro maxillary soft tissue or
edema. However, no other aggressive osseous changes identified. The
pterygoid palatine fossa remains normal.

In the contralateral left maxillary sinus more homogeneous polypoid
opacity most resembles mucous retention cysts.

The nasal septum remains intact and midline, and the nasal cavity is
well pneumatized aside from the right OMC and middle meatus.

Soft tissues: Negative visible larynx, pharynx, parapharyngeal
spaces, retropharyngeal space, sublingual space, submandibular
glands and parotid glands.

The major vascular structures in the visible face and at the skull
base are patent with calcified carotid atherosclerosis including
both ICA siphons.

No upper cervical lymphadenopathy is identified.

Limited intracranial: Negative for age.
IMPRESSION: 1. Mixed density opacification of the right maxillary sinus is
contiguous with a 2 cm polypoid opacity in the posterior right
middle meatus, and has some associated aggressive bone changes along
the posterior right maxillary wall.
Therefore, sinus malignancy or lymphoma is possible, and the
material within the right maxillary sinus may reflect a combination
of tumor and obstructed secretions.

2. But no other malignant or complicating features are identified,
and I suspect only benign retention cysts in the contralateral left
maxillary sinus.
